# Patient Record
Sex: Female | Born: 2000
Health system: Southern US, Community
[De-identification: ages and names within clinical notes are randomized; demographics above are authoritative.]

## PROBLEM LIST (undated history)

## (undated) HISTORY — PX: TOOTH EXTRACTION: SUR596

---
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
## 2022-11-15 ENCOUNTER — Ambulatory Visit
Admission: EM | Admit: 2022-11-15 | Discharge: 2022-11-15 | Disposition: A | Discharge status: Home / Self Care | Payer: BC Managed Care – PPO | Attending: Family Medicine | Admitting: Family Medicine

## 2022-11-15 DIAGNOSIS — N390 Urinary tract infection, site not specified: Secondary | ICD-10-CM | POA: Insufficient documentation

## 2022-11-15 LAB — POCT URINALYSIS DIP (MANUAL ENTRY)
Bilirubin, UA: NEGATIVE
Glucose, UA: 100 mg/dL — AB
Ketones, POC UA: NEGATIVE mg/dL
Nitrite, UA: POSITIVE — AB
Protein Ur, POC: NEGATIVE mg/dL
Spec Grav, UA: 1.015 (ref 1.010–1.025)
Urobilinogen, UA: 0.2 E.U./dL
pH, UA: 5.5 (ref 5.0–8.0)

## 2022-11-15 MED ORDER — CEPHALEXIN 500 MG PO CAPS: 500.0000 mg | ORAL_CAPSULE | Freq: Two times a day (BID) | ORAL | 0 refills | Status: DC
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
## 2022-11-15 NOTE — ED Triage Notes (Signed)
Urinary frequency, burning while urinating that started beginning of February and tried OTC azo and it would help but came back yesterday with more urinary urgency.

## 2022-11-16 LAB — URINE CULTURE

## 2023-07-05 ENCOUNTER — Ambulatory Visit
Admission: EM | Admit: 2023-07-05 | Discharge: 2023-07-05 | Disposition: A | Discharge status: Home / Self Care | Payer: BC Managed Care – PPO | Attending: Nurse Practitioner | Admitting: Nurse Practitioner

## 2023-07-05 ENCOUNTER — Encounter: Payer: Self-pay | Admitting: Emergency Medicine

## 2023-07-05 DIAGNOSIS — S29012A Strain of muscle and tendon of back wall of thorax, initial encounter: Secondary | ICD-10-CM | POA: Diagnosis not present

## 2023-07-05 MED ORDER — CYCLOBENZAPRINE HCL 5 MG PO TABS: 5.0000 mg | ORAL_TABLET | Freq: Two times a day (BID) | ORAL | 0 refills | Status: AC

## 2023-07-05 MED ORDER — PREDNISONE 20 MG PO TABS: 40.0000 mg | ORAL_TABLET | Freq: Every day | ORAL | 0 refills | Status: AC

## 2023-07-05 NOTE — Discharge Instructions (Addendum)
Take medication as prescribed.  Do not take ibuprofen, Aleve, or Advil while you are taking the prednisone.  As discussed, it is recommended that you take Tylenol Arthritis Strength 650 mg tablets as directed during that time. Try to remain as active as possible. Gentle range of motion and stretching exercises to help with back spasm and pain.  I have provided you exercises to perform at least 2-3 times daily while symptoms persist. May apply ice or heat as needed.  Ice is recommended for pain or swelling, heat for spasm or stiffness.  Apply for 20 minutes, remove for 1 hour, then repeat. May take over-the-counter Tylenol Arthritis Strength 650 mg tablet approximately 1 -2 hours after taking ibuprofen for breakthrough pain. Go to the emergency department immediately if you develop weakness in your legs or feet, numbness or tingling in your upper extremities, or worsening back pain. If symptoms fail to improve, it is recommended that you follow-up with your primary care physician or with orthopedics for further evaluation. Follow-up as needed.

## 2023-07-05 NOTE — ED Triage Notes (Signed)
Left side mid back pain.  States she moved a tool box on Thursday last week and has had pain ever since.
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
## 2023-08-29 ENCOUNTER — Ambulatory Visit: Payer: BC Managed Care – PPO

## 2023-08-29 ENCOUNTER — Other Ambulatory Visit: Payer: Self-pay

## 2023-08-29 ENCOUNTER — Ambulatory Visit
Admission: EM | Admit: 2023-08-29 | Discharge: 2023-08-29 | Disposition: A | Discharge status: Home / Self Care | Payer: BC Managed Care – PPO | Attending: Nurse Practitioner | Admitting: Nurse Practitioner

## 2023-08-29 ENCOUNTER — Encounter: Payer: Self-pay | Admitting: Emergency Medicine

## 2023-08-29 DIAGNOSIS — M25562 Pain in left knee: Secondary | ICD-10-CM | POA: Diagnosis not present

## 2023-08-29 DIAGNOSIS — M25462 Effusion, left knee: Secondary | ICD-10-CM | POA: Diagnosis not present

## 2023-08-29 NOTE — Discharge Instructions (Addendum)
Xray of the left knee is pending. You will be contacted if the result of the xray is negative. You will also be able to access the results via MyChart. May take over-the-counter ibuprofen or Tylenol for pain or discomfort. RICE therapy rest, ice, compression, and elevation until your symptoms improve.  Apply ice for 20 minutes, remove for 1 hour, then repeat as often as possible.  This will help with pain and swelling. Wear the knee brace with strenuous or prolonged activity. If symptoms do not improve within the next 2 to 4 weeks, recommend following up with orthopedics.   Follow-up as needed.

## 2023-08-29 NOTE — ED Triage Notes (Addendum)
Pt reports was at work in September and reports injured left knee from a fall. Pt reports "thought it was better" and states flared-up the night before thanksgiving while cooking and intermittently "swelled" ever since. Denies being workman's comp claim.   Pt able to bear weight, steady gait.
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
## 2023-10-13 ENCOUNTER — Ambulatory Visit
Admission: EM | Admit: 2023-10-13 | Discharge: 2023-10-13 | Disposition: A | Discharge status: Home / Self Care | Payer: BC Managed Care – PPO | Attending: Family Medicine | Admitting: Family Medicine

## 2023-10-13 DIAGNOSIS — J101 Influenza due to other identified influenza virus with other respiratory manifestations: Secondary | ICD-10-CM

## 2023-10-13 DIAGNOSIS — R509 Fever, unspecified: Secondary | ICD-10-CM

## 2023-10-13 DIAGNOSIS — R000 Tachycardia, unspecified: Secondary | ICD-10-CM | POA: Diagnosis not present

## 2023-10-13 LAB — POC COVID19/FLU A&B COMBO
Covid Antigen, POC: NEGATIVE
Influenza A Antigen, POC: POSITIVE — AB
Influenza B Antigen, POC: NEGATIVE

## 2023-10-13 MED ORDER — ONDANSETRON 4 MG PO TBDP: 4.0000 mg | ORAL_TABLET | Freq: Three times a day (TID) | ORAL | 0 refills | Status: AC | PRN

## 2023-10-13 MED ORDER — OSELTAMIVIR PHOSPHATE 75 MG PO CAPS: 75.0000 mg | ORAL_CAPSULE | Freq: Two times a day (BID) | ORAL | 0 refills | Status: AC

## 2023-10-13 MED ORDER — PROMETHAZINE-DM 6.25-15 MG/5ML PO SYRP: 5.0000 mL | ORAL_SOLUTION | Freq: Four times a day (QID) | ORAL | 0 refills | Status: AC | PRN

## 2023-10-13 NOTE — ED Triage Notes (Signed)
Pt reports fever, congestion headache  chills and body aches x 1 day
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
## 2024-06-10 ENCOUNTER — Other Ambulatory Visit: Payer: Self-pay

## 2024-06-10 ENCOUNTER — Ambulatory Visit

## 2024-06-10 DIAGNOSIS — M62838 Other muscle spasm: Secondary | ICD-10-CM | POA: Diagnosis present

## 2024-06-10 DIAGNOSIS — M6281 Muscle weakness (generalized): Secondary | ICD-10-CM | POA: Insufficient documentation

## 2024-06-10 DIAGNOSIS — R293 Abnormal posture: Secondary | ICD-10-CM | POA: Diagnosis present

## 2024-06-10 DIAGNOSIS — R102 Pelvic and perineal pain: Secondary | ICD-10-CM | POA: Diagnosis present

## 2024-06-10 DIAGNOSIS — R279 Unspecified lack of coordination: Secondary | ICD-10-CM | POA: Insufficient documentation
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
## 2024-06-14 ENCOUNTER — Ambulatory Visit

## 2024-06-14 DIAGNOSIS — R293 Abnormal posture: Secondary | ICD-10-CM

## 2024-06-14 DIAGNOSIS — R279 Unspecified lack of coordination: Secondary | ICD-10-CM

## 2024-06-14 DIAGNOSIS — M62838 Other muscle spasm: Secondary | ICD-10-CM | POA: Diagnosis not present

## 2024-06-14 DIAGNOSIS — M6281 Muscle weakness (generalized): Secondary | ICD-10-CM

## 2024-06-14 DIAGNOSIS — R102 Pelvic and perineal pain: Secondary | ICD-10-CM
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
## 2024-06-22 ENCOUNTER — Telehealth: Payer: Self-pay

## 2024-06-22 ENCOUNTER — Ambulatory Visit: Payer: Self-pay

## 2024-06-22 NOTE — Telephone Encounter (Signed)
 Called and left for patient after first no-show appointment. Reviewed attendance policy. Confirmed next appointment.

## 2024-06-30 ENCOUNTER — Ambulatory Visit: Payer: Self-pay

## 2024-06-30 DIAGNOSIS — R293 Abnormal posture: Secondary | ICD-10-CM | POA: Diagnosis present

## 2024-06-30 DIAGNOSIS — R279 Unspecified lack of coordination: Secondary | ICD-10-CM | POA: Insufficient documentation

## 2024-06-30 DIAGNOSIS — M62838 Other muscle spasm: Secondary | ICD-10-CM | POA: Insufficient documentation

## 2024-06-30 DIAGNOSIS — M6281 Muscle weakness (generalized): Secondary | ICD-10-CM | POA: Diagnosis present

## 2024-06-30 DIAGNOSIS — R102 Pelvic and perineal pain unspecified side: Secondary | ICD-10-CM | POA: Insufficient documentation
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
## 2024-07-13 ENCOUNTER — Ambulatory Visit: Payer: Self-pay

## 2024-07-15 ENCOUNTER — Ambulatory Visit: Payer: Self-pay

## 2024-07-22 ENCOUNTER — Ambulatory Visit: Payer: Self-pay

## 2024-07-22 ENCOUNTER — Telehealth: Payer: Self-pay

## 2024-07-22 NOTE — Telephone Encounter (Signed)
 Left message explaining we are cancelling remaining appointments per our attendance policy due to high rate of no-show and last minute cancellations. She was told that she can call on a week by week basis to get an appointment if she would like to continue; she was also asked to call if she would like to discharge.

## 2024-07-26 ENCOUNTER — Ambulatory Visit

## 2024-08-01 ENCOUNTER — Ambulatory Visit: Admission: EM | Admit: 2024-08-01 | Discharge: 2024-08-01 | Disposition: A | Discharge status: Home / Self Care

## 2024-08-01 DIAGNOSIS — H18891 Other specified disorders of cornea, right eye: Secondary | ICD-10-CM

## 2024-08-01 MED ORDER — POLYMYXIN B-TRIMETHOPRIM 10000-0.1 UNIT/ML-% OP SOLN: 1.0000 [drp] | Freq: Four times a day (QID) | OPHTHALMIC | 0 refills | Status: AC

## 2024-08-01 NOTE — Discharge Instructions (Addendum)
 Apply eyedrops as prescribed. You may take over-the-counter Tylenol  as needed for pain, fever, or general discomfort. Apply warm compresses to the eye to help with pain or discomfort, cool compresses to help with pain or swelling. You may use over-the-counter Visine eyedrops or Clear Eyes eyedrops to help keep the eye moist and lubricated. Avoid rubbing or manipulating the eye while symptoms persist. Strict handwashing while symptoms persist. Continue to wear your eyeglasses until your symptoms resolved. Go to see your eye doctor if you develop visual changes, or other concerns. Follow-up as needed.

## 2024-08-01 NOTE — ED Triage Notes (Signed)
 Pt reports her right eye is watering, painful, and red x 2 weeks   States she removed and cleaned her contacts.
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
## 2024-08-02 ENCOUNTER — Encounter

## 2024-08-09 ENCOUNTER — Encounter
